# Patient Record
Sex: Male | Born: 1985 | Race: Black or African American | Hispanic: No | Marital: Single | Smoking: Never smoker
Health system: Southern US, Community
[De-identification: ages and names within clinical notes are randomized; demographics above are authoritative.]

## PROBLEM LIST (undated history)

## (undated) HISTORY — PX: KNEE ARTHROSCOPY: SUR90

---
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
## 2009-05-31 ENCOUNTER — Emergency Department (HOSPITAL_COMMUNITY): Admission: EM | Admit: 2009-05-31 | Discharge: 2009-05-31 | Payer: Self-pay | Admitting: Emergency Medicine

## 2010-09-15 LAB — RAPID STREP SCREEN (MED CTR MEBANE ONLY): Streptococcus, Group A Screen (Direct): POSITIVE — AB

## 2010-11-01 IMAGING — CR DG CHEST 2V
2 series · 2 of 2 positions shown · non-contrast
Comparison: Chest 05/10/2004.

CLINICAL DATA: Cough.

CHEST - 2 VIEW

[w chest pa]
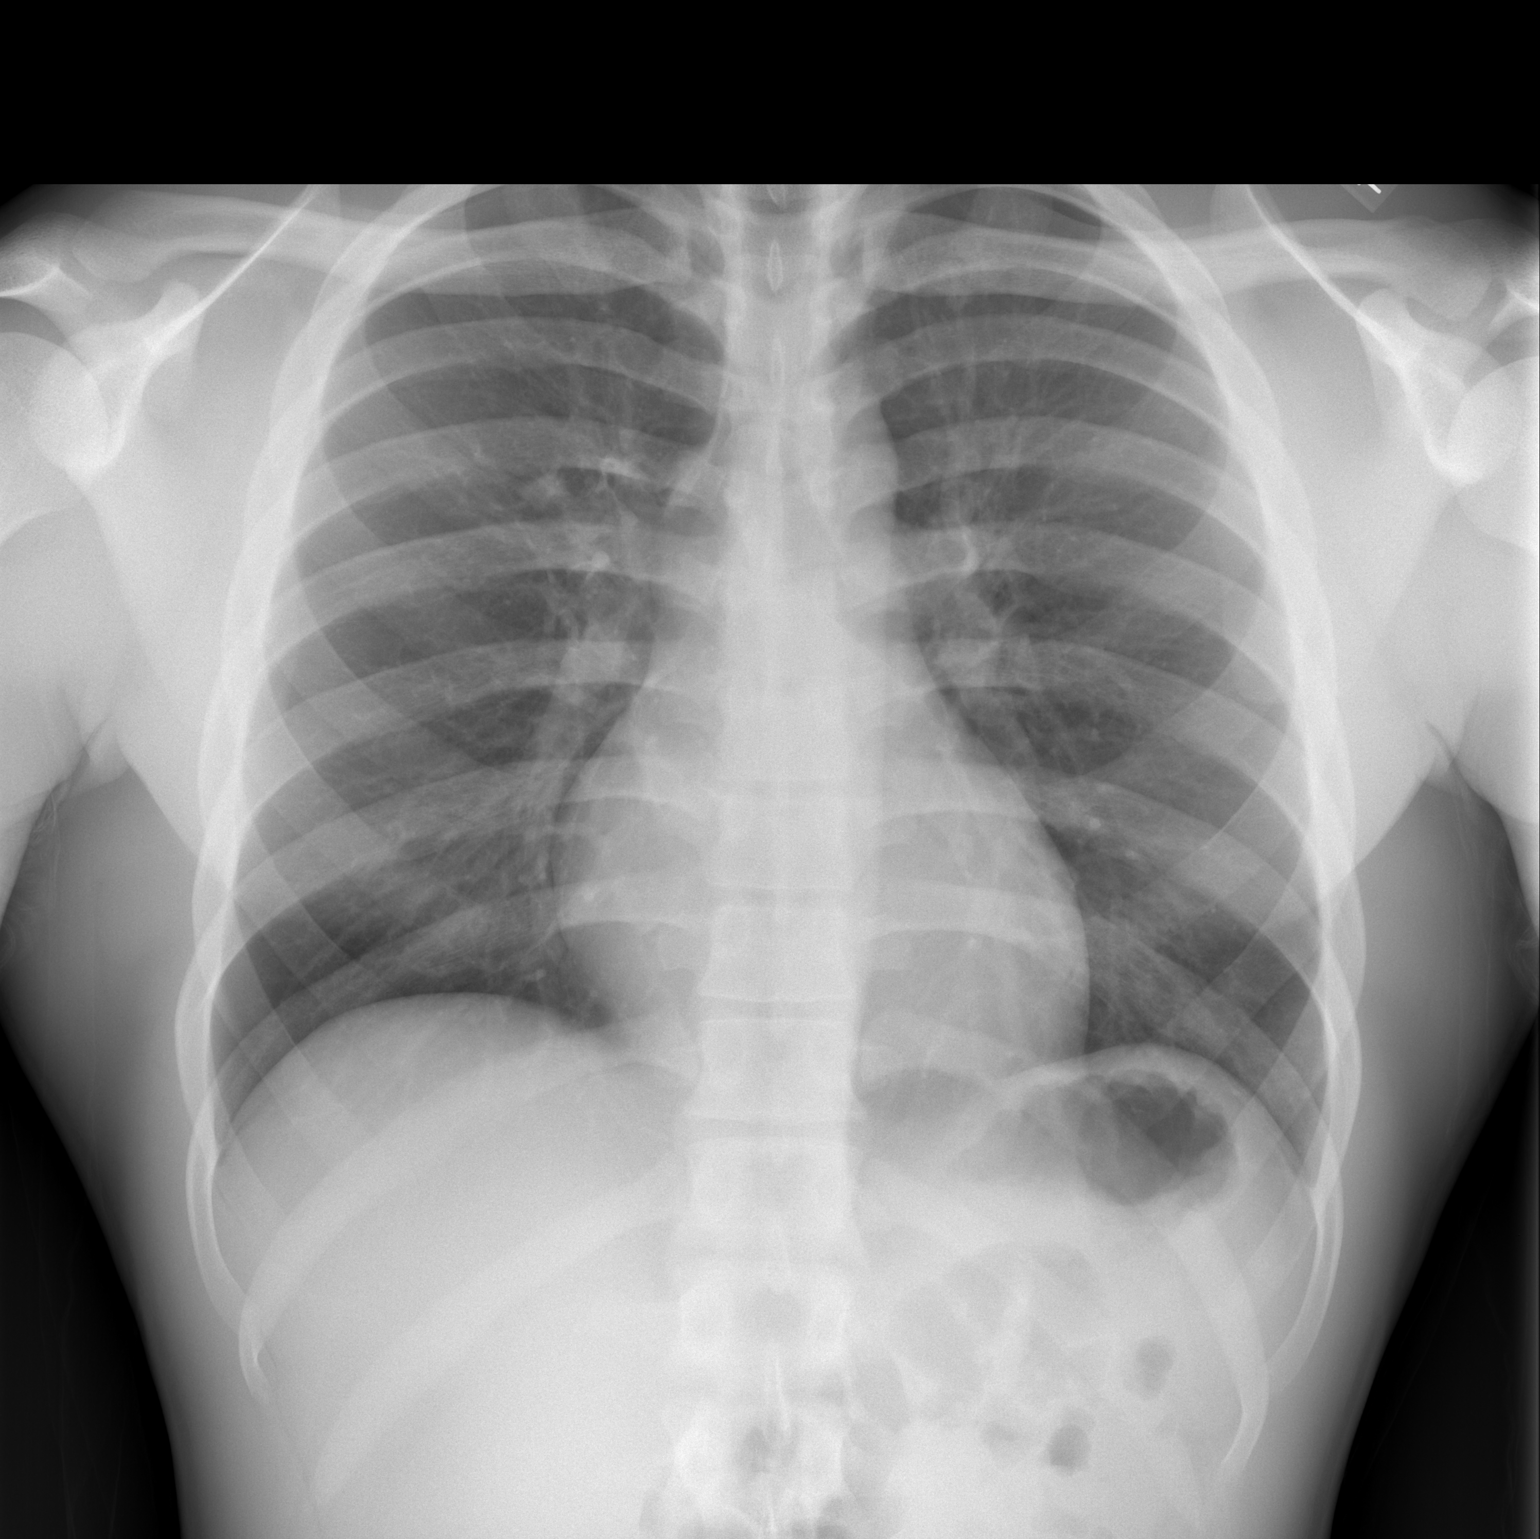

[w chest lat]
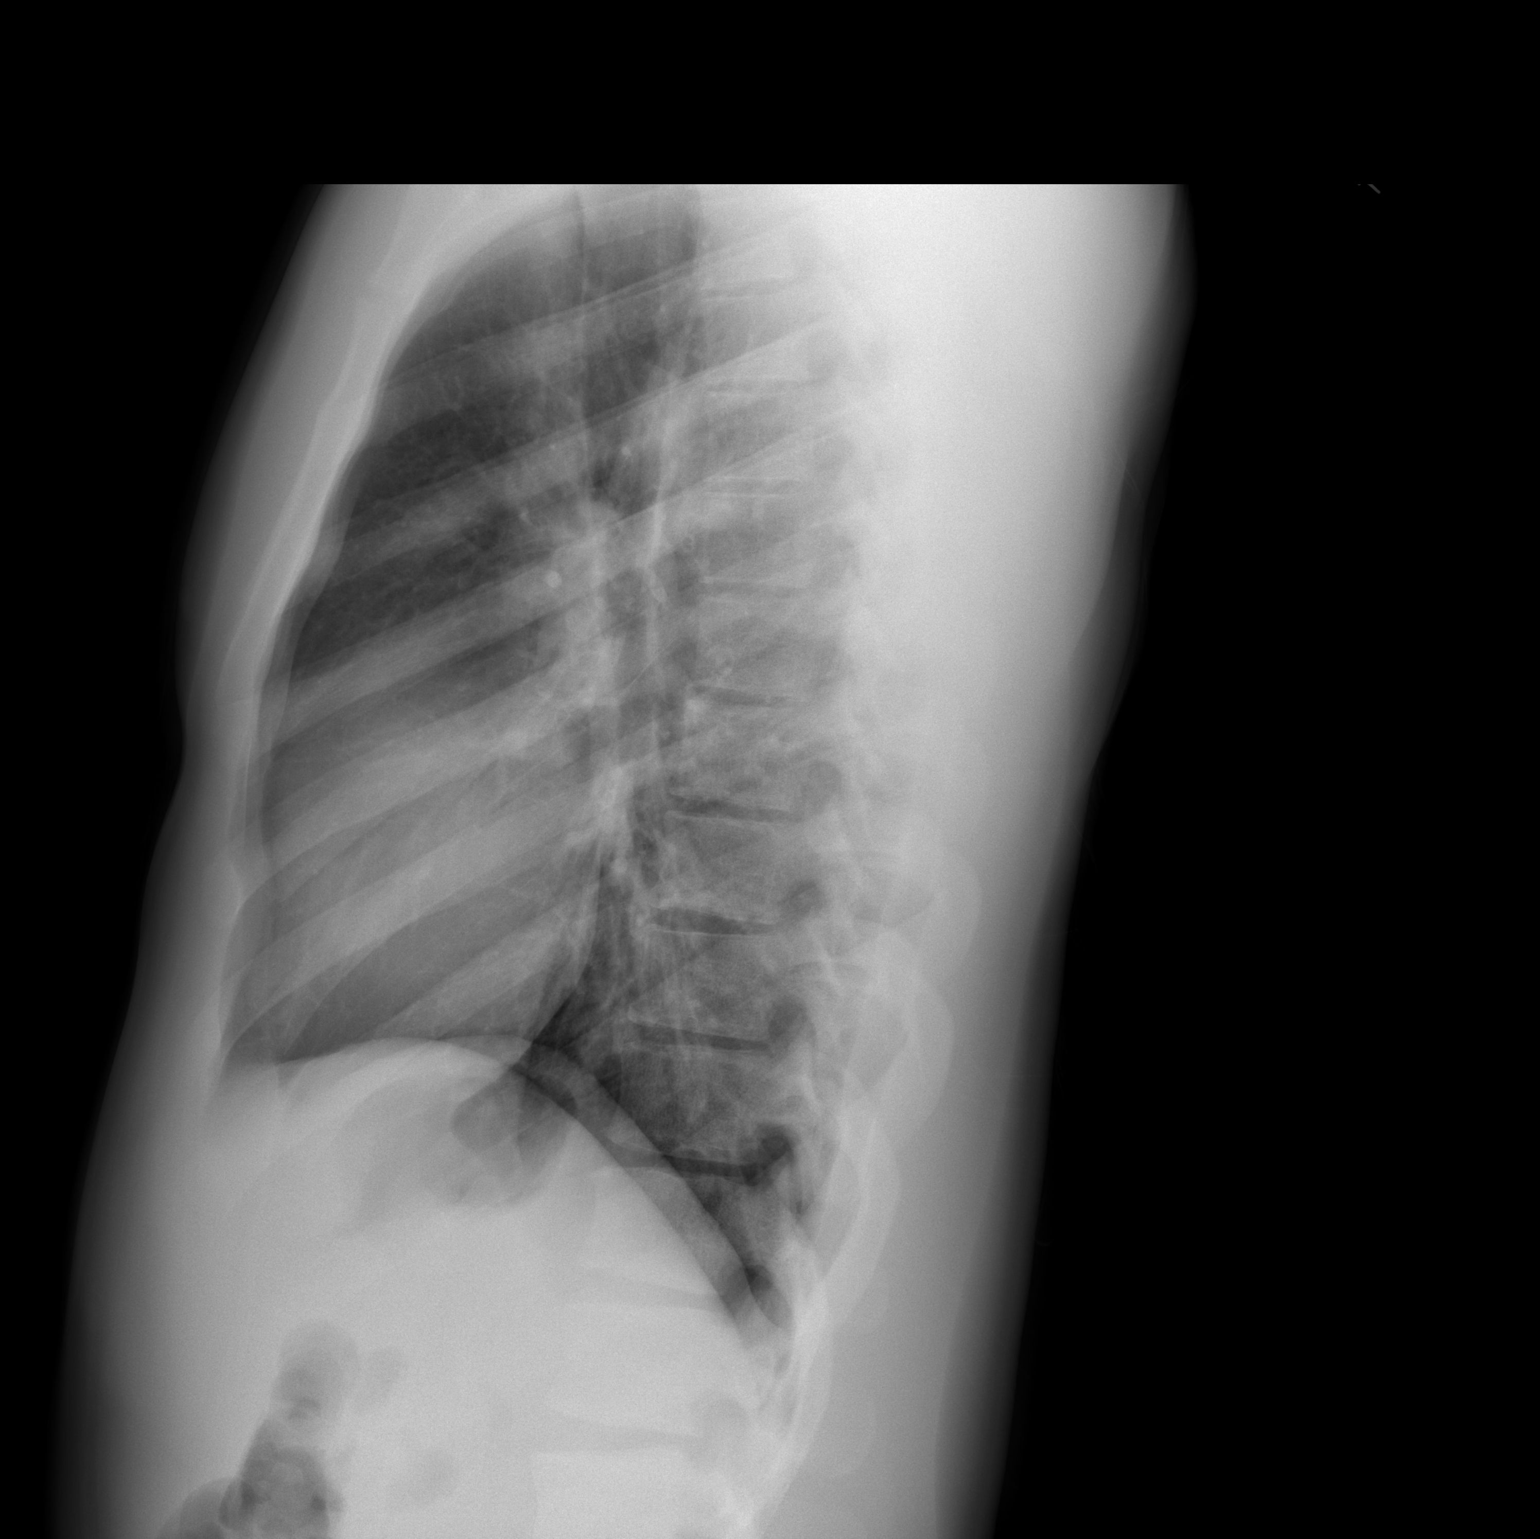

[2 of 2 positions shown; findings below may reference images not displayed]

FINDINGS: Lungs are clear.  Heart size is normal.  No pleural
effusion or focal bony abnormality.
IMPRESSION: Normal chest.

## 2011-08-29 ENCOUNTER — Encounter (HOSPITAL_COMMUNITY): Payer: Self-pay | Admitting: Emergency Medicine

## 2011-08-29 ENCOUNTER — Emergency Department (HOSPITAL_COMMUNITY)
Admission: EM | Admit: 2011-08-29 | Discharge: 2011-08-30 | Disposition: A | Discharge status: Home / Self Care | Payer: PRIVATE HEALTH INSURANCE | Attending: Emergency Medicine | Admitting: Emergency Medicine

## 2011-08-29 DIAGNOSIS — N342 Other urethritis: Secondary | ICD-10-CM | POA: Insufficient documentation

## 2011-08-29 DIAGNOSIS — R369 Urethral discharge, unspecified: Secondary | ICD-10-CM | POA: Insufficient documentation

## 2011-08-29 NOTE — ED Notes (Signed)
Pt presented to the Er stateing that he wasn't to get tested for possible STD's.

## 2011-08-30 MED ORDER — AZITHROMYCIN 250 MG PO TABS
1000.0000 mg | ORAL_TABLET | Freq: Once | ORAL | Status: AC
  Administered 2011-08-30: 1000 mg via ORAL
  Filled 2011-08-30: qty 4

## 2011-08-30 MED ORDER — CEFTRIAXONE SODIUM 250 MG IJ SOLR
250.0000 mg | Freq: Once | INTRAMUSCULAR | Status: AC
  Administered 2011-08-30: 250 mg via INTRAMUSCULAR
  Filled 2011-08-30: qty 250

## 2011-08-30 MED ORDER — LIDOCAINE HCL 1 % IJ SOLN
INTRAMUSCULAR | Status: AC
  Administered 2011-08-30: 01:00:00
  Filled 2011-08-30: qty 20

## 2011-08-30 NOTE — Discharge Instructions (Signed)
No sex until after symptoms resolve, then use condoms. Have any sexual contact(s) checked by their doctor or county health department. The results of your urethral swab will be back in 1-2 days.  Follow up with primary care doctor in 1 week if symptoms fail to improve/resolve. Return to ER if worse, severe abdominal pain, fevers, other concern.      Urethritis, Adult Urethritis is an inflammation (soreness) of the urethra (the tube exiting from the bladder). It is often caused by germs that may be spread through sexual contact. TREATMENT  Urethritis will usually respond to antibiotics. These are medications that kill germs. Take all the medicine given to you. You may feel better in a couple days, but TAKE ALL MEDICINE or the infection may not be completely cured and may become more difficult to treat. Response can generally be expected in 7 to 10 days. You may require additional treatment after more testing. HOME CARE INSTRUCTIONS  Not have sex until the test results are known and treatment is completed.   Know that you may be asked to notify your sex partner when your final test results are back.   Finish all medications as prescribed.   Prevent sexually transmitted infections including AIDS. Practice safe sex. Use condoms.  SEEK MEDICAL CARE IF:   Your symptoms are not improved in 2 to 3 days.   Your symptoms are getting worse.   Your develop abdominal pain.   You develop joint pain.  SEEK IMMEDIATE MEDICAL CARE IF:   You have a fever.   You develop severe pain in the belly, back or side.   You develop repeated vomiting.  TEST RESULTS Not all test results are available during your visit. If your test results are not back during the visit, make an appointment with your caregiver to find out the results. Do not assume everything is normal if you have not heard from your caregiver or the medical facility. It is important for you to follow-up on all of your test results. Document  Released: 11/25/2000 Document Revised: 05/21/2011 Document Reviewed: 06/17/2009 [facility] Patient Information 2012 [place], [place].      Safer Sex Your caregiver wants you to have this information about the infections that can be transmitted from sexual contact and how to prevent them. The idea behind safer sex is that you can be sexually active, and at the same time reduce the risk of giving or getting a sexually transmitted disease (STD). Every person should be aware of how to prevent him or herself and his or her sex partner from getting an STD. CAUSES OF STDS STDs are transmitted by sharing body fluids, which contain viruses and bacteria. The following fluids all transmit infections during sexual intercourse and sex acts:  Semen.   Saliva.   Urine.   Blood.   Vaginal mucus.  Examples of STDs include:  Chlamydia.   Gonorrhea.   Genital herpes.   Hepatitis B.   Human immunodeficiency virus or acquired immunodeficiency syndrome (HIV or AIDS).   Syphilis.   Trichomonas.   Pubic lice.   Human papillomavirus (HPV), which may include:   Genital warts.   Cervical dysplasia.   Cervical cancer (can develop with certain types of HPV).  SYMPTOMS  Sexual diseases often cause few or no symptoms until they are advanced, so a person can be infected and spread the infection without knowing it. Some STDs respond to treatment very well. Others, like HIV and herpes, cannot be cured, but are treated to reduce  their effects. Specific symptoms include:  Abnormal vaginal discharge.   Irritation or itching in and around the vagina, and in the pubic hair.   Pain during sexual intercourse.   Bleeding during sexual intercourse.   Pelvic or abdominal pain.   Fever.   Growths in and around the vagina.   An ulcer in or around the vagina.   Swollen glands in the groin area.  DIAGNOSIS   Blood tests.   Pap test.   Culture test of abnormal vaginal discharge.   A test that  applies a solution and examines the cervix with a lighted magnifying scope (colposcopy).   A test that examines the pelvis with a lighted tube, through a small incision (laparoscopy).  TREATMENT  The treatment will depend on the cause of the STD.  Antibiotic treatment by injection, oral, creams, or suppositories in the vagina.   Over-the-counter medicated shampoo, to get rid of pubic lice.   Removing or treating growths with medicine, freezing, burning (electrocautery), or surgery.   Surgery treatment for HPV of the cervix.   Supportive medicines for herpes, HIV, AIDS, and hepatitis.  Being careful cannot eliminate all risk of infection, but sex can be made much safer. Safe sexual practices include body massage and gentle touching. Masturbation is safe, as long as body fluids do not contact skin that has sores or cuts. Dry kissing and oral sex on a man wearing a latex condom or on a woman wearing a male condom is also safe. Slightly less safe is intercourse while the man wears a latex condom or wet kissing. It is also safer to have one sex partner that you know is not having sex with anyone else. LENGTH OF ILLNESS An STD might be treated and cured in a week, sometimes a month, or more. And it can linger with symptoms for many years. STDs can also cause damage to the male organs. This can cause chronic pain, infertility, and recurrence of the STD, especially herpes, hepatitis, HIV, and HPV. HOME CARE INSTRUCTIONS AND PREVENTION  Alcohol and recreational drugs are often the reason given for not practicing safer sex. These substances affect your judgment. Alcohol and recreational drugs can also impair your immune system, making you more vulnerable to disease.   Do not engage in risky and dangerous sexual practices, including:   Vaginal or anal sex without a condom.   Oral sex on a man without a condom.   Oral sex on a woman without a male condom.   Using saliva to lubricate a  condom.   Any other sexual contact in which body fluids or blood from one partner contact the other partner.   You should use only latex condoms for men and water soluble lubricants. Petroleum based lubricants or oils used to lubricate a condom will weaken the condom and increase the chance that it will break.   Think very carefully before having sex with anyone who is high risk for STDs and HIV. This includes IV drug users, people with multiple sexual partners, or people who have had an STD, or a positive hepatitis or HIV blood test.   Remember that even if your partner has had only one previous partner, their previous partner might have had multiple partners. If so, you are at high risk of being exposed to an STD. You and your sex partner should be the only sex partners with each other, with no one else involved.   A vaccine is available for hepatitis B and HPV through  your caregiver or the Public Health Department. Everyone should be vaccinated with these vaccines.   Avoid risky sex practices. Sex acts that can break the skin make you more likely to get an STD.  SEEK MEDICAL CARE IF:   If you think you have an STD, even if you do not have any symptoms. Contact your caregiver for evaluation and treatment, if needed.   You think or know your sex partner has acquired an STD.   You have any of the symptoms mentioned above.  Document Released: 07/09/2004 Document Revised: 05/21/2011 Document Reviewed: 05/01/2009 [facility] Patient Information 2012 [place], [place].

## 2011-08-30 NOTE — ED Provider Notes (Signed)
History     CSN: [id number]  Arrival date & time 08/29/11  2035   First MD Initiated Contact with Patient 08/30/11 0002      Chief Complaint  Patient presents with  . STD testing     (Consider location/radiation/quality/duration/timing/severity/associated sxs/prior treatment) The history is provided by the patient.  pt c/o mild urethral discharge in past 1-2 days. Notes recent unprotected sex. No known std exposure. No genital sores or ulcers. No dysuria. No abd pain. No nv. No rash. States otherwise feels well/normal. No fever or chills.   History reviewed. No pertinent past medical history.  Past Surgical History  Procedure Date  . Knee arthroscopy     History reviewed. No pertinent family history.  History  Substance Use Topics  . Smoking status: Never Smoker   . Smokeless tobacco: Not on file  . Alcohol Use: No      Review of Systems  Constitutional: Negative for fever and chills.  Gastrointestinal: Negative for abdominal pain.  Genitourinary: Negative for dysuria, flank pain, scrotal swelling and testicular pain.  Skin: Negative for rash.    Allergies  Review of patient's allergies indicates no known allergies.  Home Medications  No current outpatient prescriptions on file.  There were no vitals taken for this visit.  Physical Exam  Nursing note and vitals reviewed. Constitutional: He is oriented to person, place, and time. He appears well-developed and well-nourished. No distress.  HENT:  Head: Atraumatic.  Eyes: Conjunctivae are normal.  Neck: Neck supple. No tracheal deviation present.  Cardiovascular: Normal rate.   Pulmonary/Chest: Effort normal. No accessory muscle usage. No respiratory distress.  Abdominal: Soft. He exhibits no distension. There is no tenderness.  Genitourinary:       No cva tenderness. No testicular swelling or tenderness. Mild urethral discharge.   Musculoskeletal: Normal range of motion.  Neurological: He is alert and  oriented to person, place, and time.  Skin: Skin is warm and dry. No rash noted.  Psychiatric: He has a normal mood and affect.    ED Course  Procedures (including critical care time)     MDM  Confirmed nkda w pt. Mild urethral discharge. Rocephin im and zithromax po. Urethral swab sent.         Suzi Roots, MD 08/30/11 [phone]

## 2011-08-31 LAB — GC/CHLAMYDIA PROBE AMP, GENITAL
Chlamydia, DNA Probe: NEGATIVE
GC Probe Amp, Genital: NEGATIVE

## 2012-05-27 ENCOUNTER — Emergency Department (HOSPITAL_COMMUNITY)
Admission: EM | Admit: 2012-05-27 | Discharge: 2012-05-27 | Disposition: A | Discharge status: Home / Self Care | Payer: Self-pay | Attending: Emergency Medicine | Admitting: Emergency Medicine

## 2012-05-27 ENCOUNTER — Encounter (HOSPITAL_COMMUNITY): Payer: Self-pay | Admitting: *Deleted

## 2012-05-27 DIAGNOSIS — R112 Nausea with vomiting, unspecified: Secondary | ICD-10-CM | POA: Insufficient documentation

## 2012-05-27 DIAGNOSIS — R1084 Generalized abdominal pain: Secondary | ICD-10-CM | POA: Insufficient documentation

## 2012-05-27 MED ORDER — ONDANSETRON HCL 4 MG/2ML IJ SOLN
4.0000 mg | Freq: Once | INTRAMUSCULAR | Status: AC
  Administered 2012-05-27: 4 mg via INTRAVENOUS
  Filled 2012-05-27: qty 2

## 2012-05-27 MED ORDER — SODIUM CHLORIDE 0.9 % IV BOLUS (SEPSIS)
1000.0000 mL | Freq: Once | INTRAVENOUS | Status: AC
  Administered 2012-05-27: 1000 mL via INTRAVENOUS

## 2012-05-27 MED ORDER — ONDANSETRON 8 MG PO TBDP: 8.0000 mg | ORAL_TABLET | Freq: Three times a day (TID) | ORAL | Status: DC | PRN

## 2012-05-27 NOTE — Discharge Instructions (Signed)
Nausea and Vomiting Nausea is a sick feeling that often comes before throwing up (vomiting). Vomiting is a reflex where stomach contents come out of your mouth. Vomiting can cause severe loss of body fluids (dehydration). Children and elderly adults can become dehydrated quickly, especially if they also have diarrhea. Nausea and vomiting are symptoms of a condition or disease. It is important to find the cause of your symptoms. CAUSES   Direct irritation of the stomach lining. This irritation can result from increased acid production (gastroesophageal reflux disease), infection, food poisoning, taking certain medicines (such as nonsteroidal anti-inflammatory drugs), alcohol use, or tobacco use.  Signals from the brain.These signals could be caused by a headache, heat exposure, an inner ear disturbance, increased pressure in the brain from injury, infection, a tumor, or a concussion, pain, emotional stimulus, or metabolic problems.  An obstruction in the gastrointestinal tract (bowel obstruction).  Illnesses such as diabetes, hepatitis, gallbladder problems, appendicitis, kidney problems, cancer, sepsis, atypical symptoms of a heart attack, or eating disorders.  Medical treatments such as chemotherapy and radiation.  Receiving medicine that makes you sleep (general anesthetic) during surgery. DIAGNOSIS Your caregiver may ask for tests to be done if the problems do not improve after a few days. Tests may also be done if symptoms are severe or if the reason for the nausea and vomiting is not clear. Tests may include:  Urine tests.  Blood tests.  Stool tests.  Cultures (to look for evidence of infection).  X-rays or other imaging studies. Test results can help your caregiver make decisions about treatment or the need for additional tests. TREATMENT You need to stay well hydrated. Drink frequently but in small amounts.You may wish to drink water, sports drinks, clear broth, or eat frozen  ice pops or gelatin dessert to help stay hydrated.When you eat, eating slowly may help prevent nausea.There are also some antinausea medicines that may help prevent nausea. HOME CARE INSTRUCTIONS   Take all medicine as directed by your caregiver.  If you do not have an appetite, do not force yourself to eat. However, you must continue to drink fluids.  If you have an appetite, eat a normal diet unless your caregiver tells you differently.  Eat a variety of complex carbohydrates (rice, wheat, potatoes, bread), lean meats, yogurt, fruits, and vegetables.  Avoid high-fat foods because they are more difficult to digest.  Drink enough water and fluids to keep your urine clear or pale yellow.  If you are dehydrated, ask your caregiver for specific rehydration instructions. Signs of dehydration may include:  Severe thirst.  Dry lips and mouth.  Dizziness.  Dark urine.  Decreasing urine frequency and amount.  Confusion.  Rapid breathing or pulse. SEEK IMMEDIATE MEDICAL CARE IF:   You have blood or brown flecks (like coffee grounds) in your vomit.  You have black or bloody stools.  You have a severe headache or stiff neck.  You are confused.  You have severe abdominal pain.  You have chest pain or trouble breathing.  You do not urinate at least once every 8 hours.  You develop cold or clammy skin.  You continue to vomit for longer than 24 to 48 hours.  You have a fever. MAKE SURE YOU:   Understand these instructions.  Will watch your condition.  Will get help right away if you are not doing well or get worse. Document Released: 06/01/2005 Document Revised: 08/24/2011 Document Reviewed: 10/29/2010 [employer] Patient Information 2013 [employer].  

## 2012-05-27 NOTE — ED Notes (Signed)
PT states he woke up feeling nauseated and has been vomiting since this am. Denies fever. Reports generalized abdominal pain.

## 2012-05-27 NOTE — ED Provider Notes (Signed)
History     CSN: [id number]  Arrival date & time 05/27/12  1352   First MD Initiated Contact with Patient 05/27/12 1551      Chief Complaint  Patient presents with  . Emesis    (Consider location/radiation/quality/duration/timing/severity/associated sxs/prior treatment) Patient is a 26 y.o. male presenting with vomiting. The history is provided by the patient.  Emesis  This is a new problem. Associated symptoms include abdominal pain. Pertinent negatives include no diarrhea, no fever and no headaches.   patient developed nausea and vomiting this morning. No fevers. Mild abdominal pain, but states it feels as if it is the muscles from throwing up. No diarrhea. He has had sick contacts with other family members who were throwing up. No fevers. No cough. No blood in emesis. No previous abdominal surgery. No chills.  History reviewed. No pertinent past medical history.  Past Surgical History  Procedure Date  . Knee arthroscopy     No family history on file.  History  Substance Use Topics  . Smoking status: Never Smoker   . Smokeless tobacco: Not on file  . Alcohol Use: No      Review of Systems  Constitutional: Negative for fever.  Respiratory: Negative for chest tightness and shortness of breath.   Cardiovascular: Negative for chest pain and leg swelling.  Gastrointestinal: Positive for nausea, vomiting and abdominal pain. Negative for diarrhea.  Genitourinary: Negative for flank pain.  Musculoskeletal: Negative for back pain.  Skin: Negative for rash.  Neurological: Negative for weakness, numbness and headaches.  Psychiatric/Behavioral: Negative for behavioral problems.    Allergies  Review of patient's allergies indicates no known allergies.  Home Medications   Current Outpatient Rx  Name  Route  Sig  Dispense  Refill  . ONDANSETRON 8 MG PO TBDP   Oral   Take 1 tablet (8 mg total) by mouth every 8 (eight) hours as needed for nausea.   20 tablet   0      BP 129/70  Pulse 62  Temp 99.8 F (37.7 C) (Oral)  Resp 16  SpO2 100%  Physical Exam  Nursing note and vitals reviewed. Constitutional: He is oriented to person, place, and time. He appears well-developed and well-nourished.       Patient is actively vomiting  HENT:  Head: Normocephalic and atraumatic.  Cardiovascular: Normal rate, regular rhythm and normal heart sounds.   No murmur heard. Pulmonary/Chest: Effort normal and breath sounds normal.  Abdominal: Soft. Bowel sounds are normal. He exhibits no distension and no mass. There is no tenderness. There is no rebound and no guarding.  Musculoskeletal: Normal range of motion. He exhibits no edema.  Neurological: He is alert and oriented to person, place, and time.  Skin: Skin is warm.  Psychiatric: He has a normal mood and affect.    ED Course  Procedures (including critical care time)  Labs Reviewed - No data to display No results found.   1. Nausea and vomiting       MDM  Patient nausea and vomiting. Has had sick contacts. Minimal abdominal tenderness. He is tolerated orals here. His vitals are reassuring. He is given 2 L of IV fluid. He'll be discharged home with Zofran ODT and will followup as needed        Juliet Rude. Rubin Payor, MD 05/27/12 1807

## 2014-04-05 ENCOUNTER — Encounter (HOSPITAL_COMMUNITY): Payer: Self-pay | Admitting: Emergency Medicine

## 2014-04-05 ENCOUNTER — Emergency Department (HOSPITAL_COMMUNITY)
Admission: EM | Admit: 2014-04-05 | Discharge: 2014-04-05 | Disposition: A | Discharge status: Home / Self Care | Payer: No Typology Code available for payment source | Attending: Emergency Medicine | Admitting: Emergency Medicine

## 2014-04-05 DIAGNOSIS — Z202 Contact with and (suspected) exposure to infections with a predominantly sexual mode of transmission: Secondary | ICD-10-CM | POA: Insufficient documentation

## 2014-04-05 LAB — URINALYSIS, ROUTINE W REFLEX MICROSCOPIC
Bilirubin Urine: NEGATIVE
Glucose, UA: NEGATIVE mg/dL
Hgb urine dipstick: NEGATIVE
Ketones, ur: NEGATIVE mg/dL
Leukocytes, UA: NEGATIVE
Nitrite: NEGATIVE
Protein, ur: NEGATIVE mg/dL
Specific Gravity, Urine: 1.034 — ABNORMAL HIGH (ref 1.005–1.030)
Urobilinogen, UA: 0.2 mg/dL (ref 0.0–1.0)
pH: 5.5 (ref 5.0–8.0)

## 2014-04-05 LAB — GC/CHLAMYDIA PROBE AMP
CT Probe RNA: NEGATIVE
GC Probe RNA: NEGATIVE

## 2014-04-05 MED ORDER — AZITHROMYCIN 250 MG PO TABS
1000.0000 mg | ORAL_TABLET | Freq: Once | ORAL | Status: AC
  Administered 2014-04-05: 1000 mg via ORAL
  Filled 2014-04-05: qty 4

## 2014-04-05 MED ORDER — CEFTRIAXONE SODIUM 250 MG IJ SOLR
250.0000 mg | Freq: Once | INTRAMUSCULAR | Status: AC
  Administered 2014-04-05: 250 mg via INTRAMUSCULAR
  Filled 2014-04-05: qty 250

## 2014-04-05 NOTE — ED Notes (Signed)
Pt wants to be tested for "UTI" after his lady friend admitted to sleeping with other guys.  Pt denies any sx.

## 2014-04-05 NOTE — ED Provider Notes (Signed)
Medical screening examination/treatment/procedure(s) were performed by non-physician practitioner and as supervising physician I was immediately available for consultation/collaboration.   EKG Interpretation None       Raeford RazorStephen Dandrea Widdowson, MD 04/05/14 1047

## 2014-04-05 NOTE — Discharge Instructions (Signed)
You have been treated in the emergency department for an infection, possibly sexually transmitted. Results of your gonorrhea and chlamydia tests are pending and you will be notified if they are positive. It is very important to practice safe sex and use condoms when sexually active. If your results are positive you need to notify all sexual partners so they can be treated as well. The website https://garcia.[email or web address]://www.dontspreadit.[email or web address]/ can be used to send anonymous text messages or emails to alert sexual contacts. Follow up with your doctor in regards to today's visit.    Gonorrhea and Chlamydia SYMPTOMS  In females, symptoms may go unnoticed. Symptoms that are more noticeable can include:  Belly (abdominal) pain.  Painful intercourse.  Watery mucous-like discharge from the vagina.  Miscarriage.  Discomfort when urinating.  Inflammation of the rectum.  Abnormal gray-green frothy vaginal discharge  Vaginal itching and irritatio  Itching and irritation of the area outside the vagina.   Painful urination.  Bleeding after sexual intercourse.  In males, symptoms include:  Burning with urination.  Pain in the testicles.  Watery mucous-like discharge from the penis.  It can cause longstanding (chronic) pelvic pain after frequent infections.  TREATMENT  PID can cause women to not be able to have children (sterile) if left untreated or if half-treated.  It is important to finish ALL medications given to you.  This is a sexually transmitted infection. So you are also at risk for other sexually transmitted diseases, including HIV (AIDS), it is recommended that you get tested. HOME CARE INSTRUCTIONS  Warning: This infection is contagious. Do not have sex until treatment is completed. Follow up at your caregiver's office or the clinic to which you were referred. If your diagnosis (learning what is wrong) is confirmed by culture or some other method, your recent sexual contacts need treatment. Even if they are symptom  free or have a negative culture or evaluation, they should be treated.  PREVENTION  Women should use sanitary pads instead of tampons for vaginal discharge.  Wipe front to back after using the toilet and avoid douching.   Practice safe sex, use condoms, have only one sex partner and be sure your sex partner is not having sex with others.  Ask your caregiver to test you for chlamydia at your regular checkups or sooner if you are having symptoms.  Ask for further information if you are pregnant.  SEEK IMMEDIATE MEDICAL CARE IF:  You develop an oral temperature above 102 F (38.9 C), not controlled by medications or lasting more than 2 days.  You develop an increase in pain.  You develop any type of abnormal discharge.  You develop vaginal bleeding and it is not time for your period.  You develop painful intercourse.   RESOURCE GUIDE  Dental Problems  Patients with Medicaid: [facility]                     [facility] [phone]                                           [phone] Phone:  [phone]  Phone:  [phone]  If unable to pay or uninsured, contact:  [facility] or [facility] to become qualified for the adult dental clinic.  Chronic Pain Problems Contact [facility]  [phone] Patients need to be referred by their primary care doctor.  Insufficient Money for Medicine Contact United Way:  call "211" or [facility] [phone].  No Primary Care Doctor Call [facility]  [phone] Other agencies that provide inexpensive medical care    [facility]  [phone]    [facility]  [phone]    [facility]  [phone]    [facility]  [phone]    [facility]  [phone]    [facility]  [phone]  [facility]  [phone] [facility]  [phone] [facility]   [phone] (emergency services [phone])  Substance Abuse Resources [facility]  [phone] [facility] [phone] [facility] [phone] [facility] [phone] Residential & Outpatient Substance Abuse Program  [phone]  Abuse/Neglect [facility] Hotline 913-438-7211(336) (336)[phone] [facility] Hotline [phone] (After Hours)  Emergency Shelter [facility] [phone]) [phone]  [facility] [phone](336) [phone] [facility] [phone]) [phone]  MRSA Hotline #:   709-634-5442(440) 572-7057    [facility] Resources  [facility]     [employer]                          [facility] [address] [place]                       [address]      [address]  [place]                                                [place]                            [place] Phone:  [phone]                                   Phone:  [phone]                 Phone:  [phone]  [facility] Phone:  [phone]  [facility] Hotline [phone](336) [phone] (After Hours)

## 2014-04-05 NOTE — ED Provider Notes (Signed)
CSN: [id number]     Arrival date & time 04/05/14  0706 History   First MD Initiated Contact with Patient 04/05/14 0719     Chief Complaint  Patient presents with  . Exposure to STD     (Consider location/radiation/quality/duration/timing/severity/associated sxs/prior Treatment) HPI Comments: Patient is a 28 year old male past medical history of Chlamydia presents emergency room for STD check. The patient reports unprotected sexual intercourse with a male, male is has multiple sexual partners at this time. Patient denies symptoms.  Patient is a 28 y.o. male presenting with STD exposure. The history is provided by the patient. No language interpreter was used.  Exposure to STD Pertinent negatives include no chills or fever.    History reviewed. No pertinent past medical history. Past Surgical History  Procedure Laterality Date  . Knee arthroscopy     No family history on file. History  Substance Use Topics  . Smoking status: Never Smoker   . Smokeless tobacco: Not on file  . Alcohol Use: No    Review of Systems  Constitutional: Negative for fever and chills.  Genitourinary: Negative for dysuria, hematuria, penile swelling, scrotal swelling and genital sores.      Allergies  Review of patient's allergies indicates no known allergies.  Home Medications   Prior to Admission medications   Not on File   BP 145/87  Pulse 50  Temp(Src) 98.1 F (36.7 C) (Oral)  Resp 18  SpO2 100% Physical Exam  Nursing note and vitals reviewed. Constitutional: He appears well-developed and well-nourished.  HENT:  Head: Normocephalic and atraumatic.  Neck: Neck supple.  Genitourinary: Penis normal. Right testis shows no swelling and no tenderness. Left testis shows no swelling and no tenderness. Circumcised. No discharge found.  Flesh colored lesion to right base of penis, non-tender. Chaperone present.   Lymphadenopathy:       Right: No inguinal adenopathy present.       Left:  No inguinal adenopathy present.  Skin: Skin is warm and dry.  Psychiatric: He has a normal mood and affect.    ED Course  Procedures (including critical care time) Labs Review Labs Reviewed  URINALYSIS, ROUTINE W REFLEX MICROSCOPIC - Abnormal; Notable for the following:    Specific Gravity, Urine 1.034 (*)    All other components within normal limits  GC/CHLAMYDIA PROBE AMP    Imaging Review No results found.   EKG Interpretation None      MDM   Final diagnoses:  Possible exposure to STD   Patient presents for possible STD exposure, currently asymptomatic. [facility]. Urine without signs of infection. Discussed safe sex practices with the patient. Discussed genital wart likely, viral, sexually transmitted. Meds given in ED:  Medications  azithromycin (ZITHROMAX) tablet 1,000 mg (1,000 mg Oral Given 04/05/14 0817)  cefTRIAXone (ROCEPHIN) injection 250 mg (250 mg Intramuscular Given 04/05/14 0817)    New Prescriptions   No medications on file       Mellody DrownLauren Zaccheaus Storlie, PA-C 04/05/14 [id number]

## 2014-04-05 NOTE — ED Notes (Signed)
Pt sts hes worried that his girlfriend may be cheating on him and having unprotected sex; wants to get tested for STDs & HIV

## 2014-09-14 ENCOUNTER — Emergency Department (HOSPITAL_COMMUNITY)
Admission: EM | Admit: 2014-09-14 | Discharge: 2014-09-14 | Discharge status: Absent without leave | Payer: No Typology Code available for payment source | Source: Home / Self Care

## 2017-02-06 ENCOUNTER — Encounter (HOSPITAL_COMMUNITY): Payer: Self-pay | Admitting: Emergency Medicine

## 2017-02-06 ENCOUNTER — Emergency Department (HOSPITAL_COMMUNITY)
Admission: EM | Admit: 2017-02-06 | Discharge: 2017-02-06 | Disposition: A | Discharge status: Home / Self Care | Payer: No Typology Code available for payment source

## 2017-02-06 ENCOUNTER — Emergency Department (HOSPITAL_COMMUNITY)
Admission: EM | Admit: 2017-02-06 | Discharge: 2017-02-06 | Disposition: A | Discharge status: Home / Self Care | Payer: No Typology Code available for payment source | Attending: Emergency Medicine | Admitting: Emergency Medicine

## 2017-02-06 DIAGNOSIS — R109 Unspecified abdominal pain: Secondary | ICD-10-CM | POA: Insufficient documentation

## 2017-02-06 DIAGNOSIS — R300 Dysuria: Secondary | ICD-10-CM | POA: Insufficient documentation

## 2017-02-06 LAB — URINALYSIS, ROUTINE W REFLEX MICROSCOPIC
BILIRUBIN URINE: NEGATIVE
Glucose, UA: NEGATIVE mg/dL
Hgb urine dipstick: NEGATIVE
Ketones, ur: NEGATIVE mg/dL
Leukocytes, UA: NEGATIVE
Nitrite: NEGATIVE
Protein, ur: NEGATIVE mg/dL
Specific Gravity, Urine: 1.015 (ref 1.005–1.030)
pH: 7 (ref 5.0–8.0)

## 2017-02-06 NOTE — ED Provider Notes (Signed)
[facility]-EMERGENCY DEPT Provider Note   CSN: [id number] Arrival date & time: 02/06/17  1135     History   Chief Complaint Chief Complaint  Patient presents with  . Flank Pain    HPI Gene Reyes is a 31 y.o. male.  HPI   31 year old male with a history of prior sexually transmitted diseases last of which was 2 years ago, presents to the emergency department with several weeks of intermittent dysuria usually noted in the morning. Patient denies any purulent discharge but states he does have some clearish discharge from time to time. No other alleviating or aggravating factors. Patient also endorses 2 days of left flank pain, similar to prior lower back muscle strains from heavy lifting. Pain is exacerbated with movement and palpation of this area. He denies any fevers, chills, nausea, vomiting, diarrhea, testicular pain. Denies any other physical complaints.  History reviewed. No pertinent past medical history.  There are no active problems to display for this patient.   Past Surgical History:  Procedure Laterality Date  . KNEE ARTHROSCOPY         Home Medications    Prior to Admission medications   Not on File    Family History History reviewed. No pertinent family history.  Social History Social History  Substance Use Topics  . Smoking status: Never Smoker  . Smokeless tobacco: Not on file  . Alcohol use No     Allergies   Patient has no known allergies.   Review of Systems Review of Systems All other systems are reviewed and are negative for acute change except as noted in the HPI   Physical Exam Updated Vital Signs BP 129/90 (BP Location: Left Arm)   Pulse (!) 51   Temp 97.9 F (36.6 C) (Oral)   Resp 12   Ht 5\' 11"  (1.803 m)   Wt 106.6 kg (235 lb)   SpO2 100%   BMI 32.78 kg/m   Physical Exam  Constitutional: He is oriented to person, place, and time. He appears well-developed and well-nourished. No distress.  HENT:  Head: Normocephalic  and atraumatic.  Right Ear: External ear normal.  Left Ear: External ear normal.  Nose: Nose normal.  Mouth/Throat: Mucous membranes are normal. No trismus in the jaw.  Eyes: Conjunctivae and EOM are normal. No scleral icterus.  Neck: Normal range of motion and phonation normal.  Cardiovascular: Normal rate and regular rhythm.   Pulmonary/Chest: Effort normal. No stridor. No respiratory distress.  Abdominal: He exhibits no distension. There is no tenderness. Hernia confirmed negative in the right inguinal area and confirmed negative in the left inguinal area.  Genitourinary: Testes normal and penis normal. Cremasteric reflex is present. Right testis shows no swelling and no tenderness. Right testis is descended. Left testis shows no swelling and no tenderness. Left testis is descended. Circumcised. No penile erythema or penile tenderness. No discharge found.  Musculoskeletal: Normal range of motion. He exhibits no edema.       Lumbar back: He exhibits tenderness. He exhibits no bony tenderness.       Back:  Lymphadenopathy: No inguinal adenopathy noted on the right or left side.  Neurological: He is alert and oriented to person, place, and time.  Skin: He is not diaphoretic.  Psychiatric: He has a normal mood and affect. His behavior is normal.  Vitals reviewed.    ED Treatments / Results  Labs (all labs ordered are listed, but only abnormal results are displayed) Labs Reviewed  URINALYSIS, ROUTINE W  REFLEX MICROSCOPIC  GC/CHLAMYDIA PROBE AMP ([place]) NOT AT [facility]    EKG  EKG Interpretation None       Radiology No results found.  Procedures Procedures (including critical care time)  Medications Ordered in ED Medications - No data to display   Initial Impression / Assessment and Plan / ED Course  I have reviewed the triage vital signs and the nursing notes.  Pertinent labs & imaging results that were available during my care of the patient were reviewed by me and  considered in my medical decision making (see chart for details).     No external evidence of possible urinary tract infection. UA obtained which was grossly negative. Urine GC/Chlamydia sent. We'll await the results and treat if positive.  Back pain in lumbar area for 2 days without signs of radicular pain. No acute traumatic onset. No red flag symptoms of fever, weight loss, saddle anesthesia, weakness, fecal/urinary incontinence or urinary retention.   Suspect MSK etiology. No indication for imaging emergently. Patient was recommended to take short course of scheduled NSAIDs and engage in early mobility as definitive treatment. Return precautions discussed for worsening or new concerning symptoms.    Final Clinical Impressions(s) / ED Diagnoses   Final diagnoses:  Dysuria  Left flank pain   Disposition: Discharge  Condition: Good  I have discussed the results, Dx and Tx plan with the patient who expressed understanding and agree(s) with the plan. Discharge instructions discussed at great length. The patient was given strict return precautions who verbalized understanding of the instructions. No further questions at time of discharge.    New Prescriptions   No medications on file    Follow Up: Department, [facility] [address] [place] [address] [phone]   As needed      Nira Conn, MD 02/06/17 1753

## 2017-02-06 NOTE — Discharge Instructions (Signed)
If your results are positive we will contact you in 3-4 days for treatment. If he do not hear from [place], the results were likely negative. Please ensure that we have a correct contact information for you in our system.

## 2017-02-06 NOTE — ED Triage Notes (Signed)
Pt reports pelvic pain and discharge for the past 2 weeks. Had unprotected sex. Now having L flank pain as well.

## 2017-02-08 LAB — GC/CHLAMYDIA PROBE AMP (~~LOC~~) NOT AT ARMC
Chlamydia: NEGATIVE
Neisseria Gonorrhea: NEGATIVE

## 2017-03-04 ENCOUNTER — Encounter (HOSPITAL_COMMUNITY): Payer: Self-pay

## 2017-03-04 ENCOUNTER — Emergency Department (HOSPITAL_COMMUNITY)
Admission: EM | Admit: 2017-03-04 | Discharge: 2017-03-04 | Disposition: A | Discharge status: Home / Self Care | Payer: No Typology Code available for payment source | Attending: Emergency Medicine | Admitting: Emergency Medicine

## 2017-03-04 DIAGNOSIS — Y939 Activity, unspecified: Secondary | ICD-10-CM | POA: Insufficient documentation

## 2017-03-04 DIAGNOSIS — W540XXA Bitten by dog, initial encounter: Secondary | ICD-10-CM | POA: Diagnosis not present

## 2017-03-04 DIAGNOSIS — Y999 Unspecified external cause status: Secondary | ICD-10-CM | POA: Insufficient documentation

## 2017-03-04 DIAGNOSIS — S61451A Open bite of right hand, initial encounter: Secondary | ICD-10-CM | POA: Diagnosis not present

## 2017-03-04 DIAGNOSIS — Y929 Unspecified place or not applicable: Secondary | ICD-10-CM | POA: Diagnosis not present

## 2017-03-04 DIAGNOSIS — S61459A Open bite of unspecified hand, initial encounter: Secondary | ICD-10-CM

## 2017-03-04 MED ORDER — TETANUS-DIPHTH-ACELL PERTUSSIS 5-2.5-18.5 LF-MCG/0.5 IM SUSP
0.5000 mL | Freq: Once | INTRAMUSCULAR | Status: AC
  Administered 2017-03-04: 0.5 mL via INTRAMUSCULAR
  Filled 2017-03-04: qty 0.5

## 2017-03-04 MED ORDER — AMOXICILLIN-POT CLAVULANATE 875-125 MG PO TABS
1.0000 | ORAL_TABLET | Freq: Once | ORAL | Status: AC
  Administered 2017-03-04: 1 via ORAL
  Filled 2017-03-04: qty 1

## 2017-03-04 MED ORDER — AMOXICILLIN-POT CLAVULANATE 875-125 MG PO TABS: 1.0000 | ORAL_TABLET | Freq: Two times a day (BID) | ORAL | 0 refills | Status: DC

## 2017-03-04 MED ORDER — LIDOCAINE HCL (PF) 2 % IJ SOLN
20.0000 mL | Freq: Once | INTRAMUSCULAR | Status: AC
  Administered 2017-03-04: 20 mL via INTRADERMAL
  Filled 2017-03-04: qty 20

## 2017-03-04 NOTE — ED Notes (Signed)
Pt reported that he was bit by his dog this evening on his right hand. 2 cm laceration and some small cuts on the right hand with bleeding controlled. Pt stated that his dog was up to date on all vaccinations.

## 2017-03-04 NOTE — ED Notes (Signed)
Pt has a dog bite on his right hand It's his dog and she has her shots, pt said she was in the trash and he made her get in the crate and when he went to get her out she bit him

## 2017-03-04 NOTE — Discharge Instructions (Signed)
Please read and follow all provided instructions.  Your diagnoses today include:  1. Dog bite of hand without complication, initial encounter    Tests performed today include:  Vital signs. See below for your results today.   Medications prescribed:   Augmentin - antibiotic  You have been prescribed an antibiotic medicine: take the entire course of medicine even if you are feeling better. Stopping early can cause the antibiotic not to work.  Take any prescribed medications only as directed.   Home care instructions:  Follow any educational materials and wound care instructions contained in this packet.   Keep affected area above the level of your heart when possible to minimize swelling. Wash area gently twice a day with warm soapy water. Do not apply alcohol or hydrogen peroxide. Cover the area if it draining or weeping.   Return instructions:  Return to the Emergency Department if you have:  Fever  Worsening pain  Worsening swelling of the wound  Pus draining from the wound  Redness of the skin that moves away from the wound, especially if it streaks away from the affected area   Any other emergent concerns  Your vital signs today were: BP (!) 135/105 (BP Location: Left Arm)    Pulse 81    Temp 98.3 F (36.8 C) (Oral)    Resp 16    Wt 100.8 kg (222 lb 4.8 oz)    SpO2 98%    BMI 31.00 kg/m  If your blood pressure (BP) was elevated above 135/85 this visit, please have this repeated by your doctor within one month. --------------

## 2017-03-04 NOTE — ED Notes (Signed)
Bed: WTR8 Expected date:  Expected time:  Means of arrival:  Comments: 

## 2017-03-04 NOTE — ED Provider Notes (Signed)
[facility]-EMERGENCY DEPT Provider Note   CSN: [id number] Arrival date & time: 03/04/17  1914     History   Chief Complaint Chief Complaint  Patient presents with  . Animal Bite    HPI Gene Reyes is a 31 y.o. male.  Patient presents with complaint of dog bite to his right hand sustained approximately 2 AM by a pit bull/terrier mix. This was his dog and it is up-to-date on vaccinations. Patient rinsed the wound with water prior to arrival. Patient presented to the emergency department for further evaluation. Patient complains of pain and soreness in the hand and swelling. No difficulty with movement of the fingers. No numbness or tingling. Unknown last tetanus.      History reviewed. No pertinent past medical history.  There are no active problems to display for this patient.   Past Surgical History:  Procedure Laterality Date  . KNEE ARTHROSCOPY         Home Medications    Prior to Admission medications   Medication Sig Start Date End Date Taking? Authorizing Provider  amoxicillin-clavulanate (AUGMENTIN) 875-125 MG tablet Take 1 tablet by mouth every 12 (twelve) hours. 03/04/17   Renne Crigler, PA-C    Family History History reviewed. No pertinent family history.  Social History Social History  Substance Use Topics  . Smoking status: Never Smoker  . Smokeless tobacco: Never Used  . Alcohol use No     Allergies   Patient has no known allergies.   Review of Systems Review of Systems  Constitutional: Negative for activity change.  Musculoskeletal: Positive for arthralgias and myalgias. Negative for back pain, gait problem and joint swelling.  Skin: Positive for wound.  Neurological: Negative for weakness and numbness.     Physical Exam Updated Vital Signs BP (!) 135/105 (BP Location: Left Arm)   Pulse 81   Temp 98.3 F (36.8 C) (Oral)   Resp 16   Wt 100.8 kg (222 lb 4.8 oz)   SpO2 98%   BMI 31.00 kg/m   Physical Exam  Constitutional: He  appears well-developed and well-nourished.  HENT:  Head: Normocephalic and atraumatic.  Eyes: Conjunctivae are normal.  Neck: Normal range of motion. Neck supple.  Cardiovascular: Normal pulses.  Exam reveals no decreased pulses.   Musculoskeletal: He exhibits edema and tenderness.       Right shoulder: Normal.       Right elbow: Normal.      Right wrist: Normal.       Right hand: He exhibits tenderness. He exhibits normal range of motion and no bony tenderness.       Hands: Patient with swelling about the fourth and fifth MCP joints close to the 1 cm laceration. Patient has full active flexion and extension of his fingers with 5 out of 5  strength against resistance. Wound anesthetized and explored. was able to visualize one of the extensor tendons and there did not appear to be any disruption to this tendon.   Neurological: He is alert. No sensory deficit.  Motor, sensation, and vascular distal to the injury is fully intact.   Skin: Skin is warm and dry.  Psychiatric: He has a normal mood and affect.  Nursing note and vitals reviewed.    ED Treatments / Results   Procedures .Marland KitchenLaceration Repair Date/Time: 03/04/2017 9:01 AM Performed by: Renne Crigler Authorized by: Renne Crigler   Consent:    Consent obtained:  Verbal   Consent given by:  Patient   Risks discussed:  Infection, pain, retained foreign body, poor cosmetic result, tendon damage, vascular damage, poor wound healing and nerve damage   Alternatives discussed:  No treatment Anesthesia (see MAR for exact dosages):    Anesthesia method:  Local infiltration   Local anesthetic:  Lidocaine 2% w/o epi Laceration details:    Location:  Hand   Hand location:  R hand, dorsum   Length (cm):  1 Repair type:    Repair type:  Simple Pre-procedure details:    Preparation:  Patient was prepped and draped in usual sterile fashion Exploration:    Hemostasis achieved with:  Direct pressure   Wound exploration: wound explored  through full range of motion and entire depth of wound probed and visualized     Wound extent: no foreign bodies/material noted, no muscle damage noted, no tendon damage noted and no underlying fracture noted     Contaminated: no   Treatment:    Area cleansed with:  Saline   Amount of cleaning:  Extensive   Irrigation solution:  Sterile saline   Irrigation volume:  1000cc   Irrigation method:  Pressure wash and syringe Skin repair:    Repair method:  Sutures   Suture size:  5-0   Suture material:  Fast-absorbing gut   Suture technique:  Simple interrupted   Number of sutures:  3 Approximation:    Approximation:  Loose Post-procedure details:    Dressing:  Adhesive bandage   Patient tolerance of procedure:  Tolerated well, no immediate complications   (including critical care time)  Medications Ordered in ED Medications  amoxicillin-clavulanate (AUGMENTIN) 875-125 MG per tablet 1 tablet (1 tablet Oral Given 03/04/17 0745)  Tdap (BOOSTRIX) injection 0.5 mL (0.5 mLs Intramuscular Given 03/04/17 0745)  lidocaine (XYLOCAINE) 2 % injection 20 mL (20 mLs Intradermal Given 03/04/17 0751)     Initial Impression / Assessment and Plan / ED Course  I have reviewed the triage vital signs and the nursing notes.  Pertinent labs & imaging results that were available during my care of the patient were reviewed by me and considered in my medical decision making (see chart for details).     Patient seen and examined. Work-up initiated. Medications ordered.   Vital signs reviewed and are as follows: BP (!) 135/105 (BP Location: Left Arm)   Pulse 81   Temp 98.3 F (36.8 C) (Oral)   Resp 16   Wt 100.8 kg (222 lb 4.8 oz)   SpO2 98%   BMI 31.00 kg/m   Patient treated with Augmentin and given tetanus booster. Wound fully explored and rinsed as detailed procedure note. Given the gaping of the wound, laceration was very loosely approximated with plain gut stitches.  Pt urged to return with  worsening pain, worsening swelling, expanding area of redness or streaking up extremity, fever, or any other concerns. Urged to take complete course of antibiotics as prescribed. Pt verbalizes understanding and agrees with plan.   Final Clinical Impressions(s) / ED Diagnoses   Final diagnoses:  Dog bite of hand without complication, initial encounter   Patient presents with dog bite of the hand, extensively cleaned and repaired. Placed on antibiotic prophylaxis. Dog is known, has had its rabies shots. No indications for rabies prophylaxis today. Return instructions as above.   New Prescriptions New Prescriptions   AMOXICILLIN-CLAVULANATE (AUGMENTIN) 875-125 MG TABLET    Take 1 tablet by mouth every 12 (twelve) hours.     Renne Crigler, PA-C 03/04/17 1610    Bethann Berkshire, MD 03/05/17  1244  

## 2018-04-26 ENCOUNTER — Institutional Professional Consult (permissible substitution): Payer: Self-pay | Admitting: Neurology

## 2018-05-23 ENCOUNTER — Institutional Professional Consult (permissible substitution): Payer: Self-pay | Admitting: Neurology

## 2018-07-11 ENCOUNTER — Encounter: Payer: Self-pay | Admitting: Neurology

## 2018-07-11 ENCOUNTER — Ambulatory Visit: Payer: PRIVATE HEALTH INSURANCE | Admitting: Neurology

## 2018-07-11 VITALS — BP 132/74 | HR 62 | Ht 64.0 in | Wt 230.0 lb

## 2018-07-11 DIAGNOSIS — R0681 Apnea, not elsewhere classified: Secondary | ICD-10-CM

## 2018-07-11 DIAGNOSIS — J351 Hypertrophy of tonsils: Secondary | ICD-10-CM

## 2018-07-11 DIAGNOSIS — Z9189 Other specified personal risk factors, not elsewhere classified: Secondary | ICD-10-CM

## 2018-07-11 DIAGNOSIS — R0683 Snoring: Secondary | ICD-10-CM

## 2018-07-11 DIAGNOSIS — Z820 Family history of epilepsy and other diseases of the nervous system: Secondary | ICD-10-CM | POA: Diagnosis not present

## 2018-07-11 DIAGNOSIS — E669 Obesity, unspecified: Secondary | ICD-10-CM | POA: Diagnosis not present

## 2018-07-11 NOTE — Patient Instructions (Signed)

## 2018-07-11 NOTE — Progress Notes (Signed)
Subjective:    Patient ID: Gene Reyes is a 33 y.o. male.  HPI     Huston Foley, MD, PhD [facility] [address] [address] [place], [place] [address]  Dear Reuel Boom,   I saw your patient, Gene Reyes, upon your kind request, in my sleep clinic today for initial consultation of his sleep disorder, in particular, concern for underlying obstructive sleep apnea. The patient is accompanied by his GF today. As you know, Gene Reyes is a 33 year old right-handed gentleman with an underlying medical history of hypertension, anxiety, and obesity, who reports snoring and witnessed apneas, some tiredness. I reviewed your office note from 12/03/2017. He has difficulty falling asleep. He used to work nights. 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 He works from home. Sometimes he also works from 9 PM to 3 AM such as on weekends. His mother has sleep apnea and uses a CPAP machine. He has been taking amitriptyline at night to help him sleep, 25 mg strength, he does not take it every night especially not when he has to work till 3 AM. He does not have a very set bedtime schedule, typically in bed somewhere between 1 AM and 4 AM. His wakeup time is generally around 8 AM. He does not have night to night nocturia or recurrent morning headaches, denies restless leg symptoms but his girlfriend has noted that he sometimes twitches before falling asleep. He lives with a roommate. His girlfriend and his mother have noted apneas while he is asleep in his snoring can be quite loud. He is trying to lose weight. He quit smoking last year but uses nicotine vapor. He drinks alcohol maybe once a week or so, no caffeine daily, sometimes hot tea.  His Past Medical History Is Significant For: No past medical history on file.  His Past Surgical History Is Significant For: Past Surgical History:  Procedure Laterality Date  . KNEE ARTHROSCOPY       His Family History Is Significant For: Family History  Problem Relation Age of Onset  . Asthma Mother   . Hypertension Mother   . Hypertension Father     His Social History Is Significant For: Social History   Socioeconomic History  . Marital status: Single    Spouse name: Not on file  . Number of children: Not on file  . Years of education: Not on file  . Highest education level: Not on file  Occupational History  . Not on file  Social Needs  . Financial resource strain: Not on file  . Food insecurity:    Worry: Not on file    Inability: Not on file  . Transportation needs:    Medical: Not on file    Non-medical: Not on file  Tobacco Use  . Smoking status: Never Smoker  . Smokeless tobacco: Never Used  Substance and Sexual Activity  . Alcohol use: No  . Drug use: No  . Sexual activity: Yes  Lifestyle  . Physical activity:    Days per week: Not on file    Minutes per session: Not on file  . Stress: Not on file  Relationships  . Social connections:    Talks on phone: Not on file    Gets together: Not on file    Attends religious service: Not on file    Active member of club or organization: Not on file    Attends meetings of  clubs or organizations: Not on file    Relationship status: Not on file  Other Topics Concern  . Not on file  Social History Narrative  . Not on file    His Allergies Are:  No Known Allergies:   His Current Medications Are:  Outpatient Encounter Medications as of 07/11/2018  Medication Sig  . amitriptyline (ELAVIL) 25 MG tablet Take 25 mg by mouth at bedtime.  Marland Kitchen. lisinopril-hydrochlorothiazide (PRINZIDE,ZESTORETIC) 10-12.5 MG tablet Take 1 tablet by mouth daily.   No facility-administered encounter medications on file as of 07/11/2018.   :  Review of Systems:  Out of a complete 14 point review of systems, all are reviewed and negative with the exception of these symptoms as listed below: Review of Systems  Neurological:        Pt presents today to discuss his sleep. Pt has never had a sleep study but does endorse snoring.  Epworth Sleepiness Scale 0= would never doze 1= slight chance of dozing 2= moderate chance of dozing 3= high chance of dozing  Sitting and reading: 1 Watching TV: 2 Sitting inactive in a public place (ex. Theater or meeting): 0 As a passenger in a car for an hour without a break: 0 Lying down to rest in the afternoon: 2 Sitting and talking to someone: 0 Sitting quietly after lunch (no alcohol): 0 In a car, while stopped in traffic: 0 Total: 5     Objective:  Neurological Exam  Physical Exam Physical Examination:   Vitals:   07/11/18 1114  BP: 132/74  Pulse: 62    General Examination: The patient is a very pleasant 33 y.o. male in no acute distress. He appears well-developed and well-nourished and well groomed.   HEENT: Normocephalic, atraumatic, pupils are equal, round and reactive to light and accommodation. Extraocular tracking is good without limitation to gaze excursion or nystagmus noted. Normal smooth pursuit is noted. Hearing is grossly intact. Face is symmetric with normal facial animation and normal facial sensation. Speech is clear with no dysarthria noted. There is no hypophonia. There is no lip, neck/head, jaw or voice tremor. Neck is supple with full range of passive and active motion. There are no carotid bruits on auscultation. Oropharynx exam reveals: mild mouth dryness, good dental hygiene and marked airway crowding, due to large tonsils are 3+ and larger uvula. Mallampati is class II. Neck circumference is 17 inches, he has a mild overbite. Tongue protrudes centrally and palate elevates symmetrically. Nasal inspection reveals chronic nasal passages secondary to inferior turbinate hypertrophy.no obvious septal deviation noted.   Chest: Clear to auscultation without wheezing, rhonchi or crackles noted.  Heart: S1+S2+0, regular and normal without murmurs, rubs or  gallops noted.   Abdomen: Soft, non-tender and non-distended with normal bowel sounds appreciated on auscultation.  Extremities: There is no pitting edema in the distal lower extremities bilaterally.   Skin: Warm and dry without trophic changes noted.  Musculoskeletal: exam reveals no obvious joint deformities, tenderness or joint swelling or erythema.   Neurologically:  Mental status: The patient is awake, alert and oriented in all 4 spheres. His immediate and remote memory, attention, language skills and fund of knowledge are appropriate. There is no evidence of aphasia, agnosia, apraxia or anomia. Speech is clear with normal prosody and enunciation. Thought process is linear. Mood is normal and affect is normal.  Cranial nerves II - XII are as described above under HEENT exam. In addition: shoulder shrug is normal with equal shoulder height noted. Motor exam:  Normal bulk, strength and tone is noted. There is no drift, tremor or rebound. Romberg is negative. Fine motor skills and coordination: intact with normal finger taps, normal hand movements, normal rapid alternating patting, normal foot taps and normal foot agility.  Cerebellar testing: No dysmetria or intention tremor. There is no truncal or gait ataxia.  Sensory exam: intact to light touch in the upper and lower extremities.  Gait, station and balance: He stands easily. No veering to one side is noted. No leaning to one side is noted. Posture is age-appropriate and stance is narrow based. Gait shows normal stride length and normal pace. No problems turning are noted. Tandem walk is unremarkable.  Assessment and Plan:  In summary, Gene Reyes is a very pleasant 33 y.o.-year old male with an underlying medical history of hypertension, anxiety, and obesity, whose history and physical exam are concerning for obstructive sleep apnea (OSA). I had a long chat with the patient and his GF about my findings and the diagnosis of OSA, its  prognosis and treatment options. We talked about medical treatments, surgical interventions and non-pharmacological approaches. I explained in particular the risks and ramifications of untreated moderate to severe OSA, especially with respect to developing cardiovascular disease down the Road, including congestive heart failure, difficult to treat hypertension, cardiac arrhythmias, or stroke. Even type 2 diabetes has, in part, been linked to untreated OSA. Symptoms of untreated OSA include daytime sleepiness, memory problems, mood irritability and mood disorder such as depression and anxiety, lack of energy, as well as recurrent headaches, especially morning headaches. We talked about ongoing smoking cessation and trying to maintain a healthy lifestyle in general, as well as the importance of weight control. I encouraged the patient to eat healthy, exercise daily and keep well hydrated, to keep a scheduled bedtime and wake time routine, to not skip any meals and eat healthy snacks in between meals. I advised the patient not to drive when feeling sleepy.in addition, he is reminded to make more time for sleep as 7 to 8 hours are recommended. I recommended the following at this time: sleep study with potential positive airway pressure titration.  I explained the sleep test procedure to the patient and also outlined possible surgical and non-surgical treatment options of OSA, including the use of a custom-made dental device (which would require a referral to a specialist dentist or oral surgeon), upper airway surgical options, such as pillar implants, radiofrequency surgery, tongue base surgery, and UPPP (which would involve a referral to an ENT surgeon). I also explained the CPAP treatment option to the patient, who indicated that he would be willing to try CPAP if the need arises. I explained the importance of being compliant with PAP treatment, not only for insurance purposes but primarily to improve His symptoms,  and for the patient's long term health benefit, including to reduce His cardiovascular risks. I answered all their questions today and the patient and his GF were in agreement. I plan to see him back after the sleep study is completed and encouraged him to call with any interim questions, concerns, problems or updates.   Thank you very much for allowing me to participate in the care of this nice patient. If I can be of any further assistance to you please do not hesitate to call me at [phone].  Sincerely,   Huston FoleySaima Tamma Brigandi, MD, PhD

## 2018-10-18 ENCOUNTER — Telehealth: Payer: Self-pay | Admitting: Neurology

## 2018-10-18 NOTE — Telephone Encounter (Signed)
Pt stated he wanted to check and see if he could afford sleep study and then he will call back in a week. Pt has not called back at this time. If pt calls back we will get him scheduled.

## 2019-06-02 ENCOUNTER — Other Ambulatory Visit: Payer: Self-pay

## 2019-06-02 DIAGNOSIS — Z20822 Contact with and (suspected) exposure to covid-19: Secondary | ICD-10-CM

## 2019-06-03 LAB — NOVEL CORONAVIRUS, NAA: SARS-CoV-2, NAA: NOT DETECTED

## 2019-09-11 ENCOUNTER — Ambulatory Visit: Payer: Self-pay | Attending: Internal Medicine

## 2019-09-11 ENCOUNTER — Ambulatory Visit: Payer: Self-pay

## 2019-09-11 DIAGNOSIS — Z23 Encounter for immunization: Secondary | ICD-10-CM

## 2019-09-11 NOTE — Progress Notes (Signed)
   Covid-19 Vaccination Clinic  Name:  Gene Reyes    MRN: [id number] DOB: 09/30/1985  09/11/2019  [name] was observed post Covid-19 immunization for 15 minutes without incident. He was provided with Vaccine Information Sheet and instruction to access the V-Safe system.   [name] was instructed to call 911 with any severe reactions post vaccine: Marland Kitchen Difficulty breathing  . Swelling of face and throat  . A fast heartbeat  . A bad rash all over body  . Dizziness and weakness   Immunizations Administered    Name Date Dose VIS Date Route   Pfizer COVID-19 Vaccine 09/11/2019  8:16 AM 0.3 mL 05/26/2019 Intramuscular   Manufacturer: [employer], [employer]   Lot: [id number]   NDC: [address]-8

## 2019-10-03 ENCOUNTER — Ambulatory Visit: Payer: Self-pay | Attending: Internal Medicine

## 2019-10-03 DIAGNOSIS — Z23 Encounter for immunization: Secondary | ICD-10-CM

## 2019-10-03 NOTE — Progress Notes (Signed)
   Covid-19 Vaccination Clinic  Name:  Gene Reyes    MRN: [id number] DOB: 1985-12-20  10/03/2019  Gene Reyes was observed post Covid-19 immunization for 15 minutes without incident. He was provided with Vaccine Information Sheet and instruction to access the V-Safe system.   Gene Reyes was instructed to call 911 with any severe reactions post vaccine: Marland Kitchen Difficulty breathing  . Swelling of face and throat  . A fast heartbeat  . A bad rash all over body  . Dizziness and weakness   Immunizations Administered    Name Date Dose VIS Date Route   Pfizer COVID-19 Vaccine 10/03/2019 11:34 AM 0.3 mL 08/09/2018 Intramuscular   Manufacturer: [employer], [employer]   Lot: [id number]   NDC: [address]-8

## 2023-05-14 ENCOUNTER — Emergency Department (HOSPITAL_BASED_OUTPATIENT_CLINIC_OR_DEPARTMENT_OTHER)
Admission: EM | Admit: 2023-05-14 | Discharge: 2023-05-14 | Disposition: A | Discharge status: Home / Self Care | Payer: 59 | Attending: Emergency Medicine | Admitting: Emergency Medicine

## 2023-05-14 ENCOUNTER — Encounter (HOSPITAL_BASED_OUTPATIENT_CLINIC_OR_DEPARTMENT_OTHER): Payer: Self-pay

## 2023-05-14 DIAGNOSIS — M545 Low back pain, unspecified: Secondary | ICD-10-CM | POA: Insufficient documentation

## 2023-05-14 LAB — URINALYSIS, ROUTINE W REFLEX MICROSCOPIC
Bacteria, UA: NONE SEEN
Bilirubin Urine: NEGATIVE
Glucose, UA: NEGATIVE mg/dL
Hgb urine dipstick: NEGATIVE
Ketones, ur: NEGATIVE mg/dL
Nitrite: NEGATIVE
Specific Gravity, Urine: 1.032 — ABNORMAL HIGH (ref 1.005–1.030)
pH: 6.5 (ref 5.0–8.0)

## 2023-05-14 MED ORDER — LIDOCAINE 5 % EX PTCH
2.0000 | MEDICATED_PATCH | CUTANEOUS | Status: DC
  Administered 2023-05-14: 2 via TRANSDERMAL
  Filled 2023-05-14: qty 2

## 2023-05-14 MED ORDER — KETOROLAC TROMETHAMINE 30 MG/ML IJ SOLN
30.0000 mg | Freq: Once | INTRAMUSCULAR | Status: AC
  Administered 2023-05-14: 30 mg via INTRAMUSCULAR
  Filled 2023-05-14: qty 1

## 2023-05-14 MED ORDER — KETOROLAC TROMETHAMINE 10 MG PO TABS: 10.0000 mg | ORAL_TABLET | Freq: Four times a day (QID) | ORAL | 0 refills | Status: AC | PRN

## 2023-05-14 MED ORDER — CYCLOBENZAPRINE HCL 10 MG PO TABS: 10.0000 mg | ORAL_TABLET | Freq: Two times a day (BID) | ORAL | 0 refills | Status: AC | PRN

## 2023-05-14 MED ORDER — LIDOCAINE 5 % EX PTCH: 1.0000 | MEDICATED_PATCH | CUTANEOUS | 0 refills | Status: AC

## 2023-05-14 NOTE — Discharge Instructions (Signed)
As discussed, urine did not show evidence of infection and did not have a lot of red blood cells concerning for kidney stone.  Suspect your symptoms are likely secondary to muscular injury.  Recommend continue use of Toradol in the outpatient setting.  This is in the same family as ibuprofen/Aleve so please do not use these medications together.  Also send you home with numbing patches to use as needed.  The third prescription will be a muscle laxer called Flexeril to use as needed.  It can cause drowsiness so please do not drive or perform any high risk activity and to realize its effects on you.  Please do not hesitate to return if the worrisome signs and symptoms we discussed become apparent.

## 2023-05-14 NOTE — ED Triage Notes (Signed)
He c/o non-traumatic left-sided low and mid back pain x 3 days. He denies any paresthesia(s) and is in no distress.

## 2023-05-14 NOTE — ED Notes (Signed)
 RN reviewed discharge instructions with pt. Pt verbalized understanding and had no further questions. VSS upon discharge.  

## 2023-05-14 NOTE — ED Provider Notes (Signed)
[place] EMERGENCY DEPARTMENT AT [facility] Provider Note   CSN: [id number] Arrival date & time: 05/14/23  1420     History  Chief Complaint  Patient presents with   Back Pain    Gene Reyes is a 37 y.o. male.   Back Pain  37 year old male presents emergency department complaints of low back pain.  Patient reports back pain began around 3 days ago.  States that he first noticed dull ache in his left low back upon awakening.  States the pain has been constant since then but has been intermittent in intensity.  Denies any radiation of pain.  Has been taking ibuprofen which has helped some with symptoms.  Presents due to persistence of symptoms.  Denies any fever, nausea, vomiting, abdominal pain, urinary symptoms, change in bowel habits.  Denies any saddle esthesia, bowel/bladder dysfunction, weakness/sensory deficits lower extremities, history of IV drug use, prolonged corticosteroid use, known malignancy.     No significant pertinent past medical history.  Home Medications Prior to Admission medications   Medication Sig Start Date End Date Taking? Authorizing Provider  cyclobenzaprine (FLEXERIL) 10 MG tablet Take 1 tablet (10 mg total) by mouth 2 (two) times daily as needed for muscle spasms. 05/14/23  Yes Sherian Maroon A, PA  ketorolac (TORADOL) 10 MG tablet Take 1 tablet (10 mg total) by mouth every 6 (six) hours as needed. 05/14/23  Yes Sherian Maroon A, PA  lidocaine (LIDODERM) 5 % Place 1 patch onto the skin daily. Remove & Discard patch within 12 hours or as directed by MD 05/14/23  Yes Sherian Maroon A, PA  amitriptyline (ELAVIL) 25 MG tablet Take 25 mg by mouth at bedtime.    [provider]  lisinopril-hydrochlorothiazide (PRINZIDE,ZESTORETIC) 10-12.5 MG tablet Take 1 tablet by mouth daily.    [provider]      Allergies    Patient has no known allergies.    Review of Systems   Review of Systems  Musculoskeletal:  Positive for  back pain.  All other systems reviewed and are negative.   Physical Exam Updated Vital Signs BP (!) 145/100   Pulse 68   Temp 98.5 F (36.9 C)   Resp 14   SpO2 100%  Physical Exam Vitals and nursing note reviewed.  Constitutional:      General: He is not in acute distress.    Appearance: He is well-developed.  HENT:     Head: Normocephalic and atraumatic.  Eyes:     Conjunctiva/sclera: Conjunctivae normal.  Cardiovascular:     Rate and Rhythm: Normal rate and regular rhythm.     Heart sounds: No murmur heard. Pulmonary:     Effort: Pulmonary effort is normal. No respiratory distress.     Breath sounds: Normal breath sounds.  Abdominal:     Palpations: Abdomen is soft.     Tenderness: There is no abdominal tenderness.  Musculoskeletal:        General: No swelling.     Cervical back: Neck supple.     Comments: No midline tenderness of lumbar spine.  Paraspinal tenderness noted left lumbar region versus CVA tenderness.  Muscular strain from out of 5 lower extremities.  No sensory deficits along major nerve distributions of lower extremities.  DTR symmetric at patella.  Pedal pulses 2+ bilaterally.  Skin:    General: Skin is warm and dry.     Capillary Refill: Capillary refill takes less than 2 seconds.  Neurological:     Mental Status:  He is alert.  Psychiatric:        Mood and Affect: Mood normal.     ED Results / Procedures / Treatments   Labs (all labs ordered are listed, but only abnormal results are displayed) Labs Reviewed  URINALYSIS, ROUTINE W REFLEX MICROSCOPIC - Abnormal; Notable for the following components:      Result Value   Specific Gravity, Urine 1.032 (*)    Protein, ur TRACE (*)    Leukocytes,Ua SMALL (*)    All other components within normal limits    EKG None  Radiology No results found.  Procedures Procedures    Medications Ordered in ED Medications  lidocaine (LIDODERM) 5 % 2 patch (2 patches Transdermal Patch Applied 05/14/23  1554)  ketorolac (TORADOL) 30 MG/ML injection 30 mg (30 mg Intramuscular Given 05/14/23 1554)    ED Course/ Medical Decision Making/ A&P                                 Medical Decision Making Amount and/or Complexity of Data Reviewed Labs: ordered.  Risk Prescription drug management.   This patient presents to the ED for concern of back pain, this involves an extensive number of treatment options, and is a complaint that carries with it a high risk of complications and morbidity.  The differential diagnosis includes fracture, strain/pain, dislocation, ligamentous/tendinous injury, nephrolithiasis, pyelonephritis, cauda equina/spinal epidural abscess, AAA, aortic dissection, other   Co morbidities that complicate the patient evaluation  See HPI   Additional history obtained:  Additional history obtained from EMR External records from outside source obtained and reviewed including hospital records   Lab Tests:  I Ordered, and personally interpreted labs.  The pertinent results include: UA trace proteins, small leukocytes, 11-20 WBCs   Imaging Studies ordered:  N/a   Cardiac Monitoring: / EKG:  The patient was maintained on a cardiac monitor.  I personally viewed and interpreted the cardiac monitored which showed an underlying rhythm of: sinus rhythm   Consultations Obtained:  N/a   Problem List / ED Course / Critical interventions / Medication management  Left low back pain I ordered medication including Toradol, Lidoderm   Reevaluation of the patient after these medicines showed that the patient improved I have reviewed the patients home medicines and have made adjustments as needed   Social Determinants of Health:  Denies tobacco, illicit drug use.   Test / Admission - Considered:  Left low back pain Vitals signs significant for hypertension blood pressure 145/100. Otherwise within normal range and stable throughout visit. Laboratory studies  significant for: See above 37 year old male presents emergency department with left low back pain.  Pain present for the past 3 days.  Pain began after awakening from sleep and is worsened with movement.  On exam, patient with left-sided paraspinal tenderness versus CVA tenderness.  No red flag signs for back pain on HPI/PE concerning for cauda equina/spinal epidural abscess, spinal cord compression/impingement.  UA without obvious signs of infection/RBCs concerning for pyelonephritis/nephrolithiasis.  No traumatic injury to suspect fracture/dislocation.  Suspect patient's symptoms are likely secondary to muscular injury.  Treated with Toradol in the ED and noted significant improvement.  Will recommend rest, ice, NSAIDs for treatment of pain and follow-up with primary care/orthopedics in the outpatient setting.  Treatment plan discussed at length with patient and he acknowledged understanding was agreeable to said plan.  Patient overall well-appearing, afebrile in no acute distress. Worrisome signs  and symptoms were discussed with the patient, and the patient acknowledged understanding to return to the ED if noticed. Patient was stable upon discharge.          Final Clinical Impression(s) / ED Diagnoses Final diagnoses:  Acute left-sided low back pain without sciatica    Rx / DC Orders ED Discharge Orders     None         Peter Garter, [place] 05/14/23 1654    Benjiman Core, MD 05/15/23 0001
